# Patient Record
Sex: Male | Born: 1975 | Race: Black or African American | Hispanic: No | Marital: Married | Smoking: Current every day smoker
Health system: Southern US, Community
[De-identification: ages and names within clinical notes are randomized; demographics above are authoritative.]

## PROBLEM LIST (undated history)

## (undated) DIAGNOSIS — I10 Essential (primary) hypertension: Secondary | ICD-10-CM

## (undated) HISTORY — PX: TUMOR EXCISION: SHX421

---

## 2013-06-07 ENCOUNTER — Emergency Department: Payer: Self-pay | Admitting: Internal Medicine

## 2014-01-18 ENCOUNTER — Encounter (HOSPITAL_COMMUNITY): Payer: Self-pay | Admitting: Emergency Medicine

## 2014-01-18 ENCOUNTER — Emergency Department (HOSPITAL_COMMUNITY)
Admission: EM | Admit: 2014-01-18 | Discharge: 2014-01-18 | Discharge status: Against Medical Advice | Payer: Medicaid - Out of State | Attending: Emergency Medicine | Admitting: Emergency Medicine

## 2014-01-18 DIAGNOSIS — F17200 Nicotine dependence, unspecified, uncomplicated: Secondary | ICD-10-CM | POA: Insufficient documentation

## 2014-01-18 DIAGNOSIS — R079 Chest pain, unspecified: Secondary | ICD-10-CM | POA: Diagnosis not present

## 2014-01-18 DIAGNOSIS — R0602 Shortness of breath: Secondary | ICD-10-CM | POA: Diagnosis present

## 2014-01-18 LAB — I-STAT TROPONIN, ED: Troponin i, poc: 0 ng/mL (ref 0.00–0.08)

## 2014-01-18 LAB — CBC
HEMATOCRIT: 41.5 % (ref 39.0–52.0)
Hemoglobin: 13.4 g/dL (ref 13.0–17.0)
MCH: 19.6 pg — ABNORMAL LOW (ref 26.0–34.0)
MCHC: 32.3 g/dL (ref 30.0–36.0)
MCV: 60.7 fL — AB (ref 78.0–100.0)
PLATELETS: 259 10*3/uL (ref 150–400)
RBC: 6.84 MIL/uL — AB (ref 4.22–5.81)
RDW: 18 % — ABNORMAL HIGH (ref 11.5–15.5)
WBC: 6.5 10*3/uL (ref 4.0–10.5)

## 2014-01-18 LAB — BASIC METABOLIC PANEL
ANION GAP: 13 (ref 5–15)
BUN: 13 mg/dL (ref 6–23)
CO2: 26 meq/L (ref 19–32)
CREATININE: 1.11 mg/dL (ref 0.50–1.35)
Calcium: 9.4 mg/dL (ref 8.4–10.5)
Chloride: 104 mEq/L (ref 96–112)
GFR calc Af Amer: >90 mL/min (ref >90)
GFR calc non Af Amer: 83 mL/min — ABNORMAL LOW (ref >90)
Glucose, Bld: 98 mg/dL (ref 70–99)
Potassium: 3.7 mEq/L (ref 3.7–5.3)
Sodium: 143 mEq/L (ref 137–147)

## 2014-01-18 MED ORDER — ASPIRIN 325 MG PO TABS: 325.0000 mg | ORAL_TABLET | ORAL | Status: DC

## 2014-01-18 NOTE — ED Notes (Signed)
Pt reports onset of mid sternal chest pain yesterday. States tried to rest hoping pain would go away. Pt denies radiation of pain, some SOB. Lung sounds, clear equal, pt NAD, VSS. EKG obtained, will start protocols and reassess.

## 2014-01-18 NOTE — ED Notes (Signed)
Pt states he is leaving, did not speak to RN but gave pager, pt labels, and BP cuff to registration- registration noted patient leaving.

## 2015-07-22 ENCOUNTER — Encounter: Payer: Self-pay | Admitting: *Deleted

## 2015-07-22 ENCOUNTER — Emergency Department
Admission: EM | Admit: 2015-07-22 | Discharge: 2015-07-22 | Disposition: A | Discharge status: Home / Self Care | Payer: Medicare Other | Attending: Emergency Medicine | Admitting: Emergency Medicine

## 2015-07-22 ENCOUNTER — Emergency Department: Payer: Medicare Other

## 2015-07-22 DIAGNOSIS — J189 Pneumonia, unspecified organism: Secondary | ICD-10-CM

## 2015-07-22 DIAGNOSIS — F17210 Nicotine dependence, cigarettes, uncomplicated: Secondary | ICD-10-CM | POA: Diagnosis not present

## 2015-07-22 DIAGNOSIS — R05 Cough: Secondary | ICD-10-CM | POA: Diagnosis present

## 2015-07-22 DIAGNOSIS — J159 Unspecified bacterial pneumonia: Secondary | ICD-10-CM | POA: Diagnosis not present

## 2015-07-22 LAB — RAPID INFLUENZA A&B ANTIGENS (ARMC ONLY): INFLUENZA B (ARMC): NEGATIVE

## 2015-07-22 LAB — RAPID INFLUENZA A&B ANTIGENS: Influenza A (ARMC): NEGATIVE

## 2015-07-22 MED ORDER — AZITHROMYCIN 500 MG PO TABS: 500.0000 mg | ORAL_TABLET | Freq: Every day | ORAL | Status: DC

## 2015-07-22 MED ORDER — HYDROCOD POLST-CPM POLST ER 10-8 MG/5ML PO SUER: 5.0000 mL | Freq: Two times a day (BID) | ORAL | Status: DC

## 2015-07-22 MED ORDER — HYDROCOD POLST-CPM POLST ER 10-8 MG/5ML PO SUER
5.0000 mL | Freq: Once | ORAL | Status: AC
  Administered 2015-07-22: 5 mL via ORAL

## 2015-07-22 MED ORDER — IBUPROFEN 800 MG PO TABS: 800.0000 mg | ORAL_TABLET | Freq: Three times a day (TID) | ORAL | Status: DC | PRN

## 2015-07-22 NOTE — ED Provider Notes (Signed)
[facility] Emergency Department Provider Note  ____________________________________________  Time seen: Approximately 4:18 PM  I have reviewed the triage vital signs and the nursing notes.   HISTORY  Chief Complaint Influenza    HPI Colton Ramos is a 40 y.o. male patient complaining of cough congestion fever for 2 days. Patient states taking over-the-counter medications without relief. He denies any nausea vomiting diarrhea. Patient states he has nasal congestion and a postnasal drainage. Patient also complaining of body aches. Patient rates his pain discomfort as 8/10. Describes this pain as "achy". Patient is not taking a flu shot this season.   No past medical history on file.  There are no active problems to display for this patient.   No past surgical history on file.  Current Outpatient Rx  Name  Route  Sig  Dispense  Refill  . azithromycin (ZITHROMAX) 500 MG tablet   Oral   Take 1 tablet (500 mg total) by mouth daily. Take 1 tablet daily for 3 days.   3 tablet   0   . chlorpheniramine-HYDROcodone (TUSSIONEX PENNKINETIC ER) 10-8 MG/5ML SUER   Oral   Take 5 mLs by mouth 2 (two) times daily.   115 mL   0   . ibuprofen (ADVIL,MOTRIN) 800 MG tablet   Oral   Take 1 tablet (800 mg total) by mouth every 8 (eight) hours as needed for moderate pain.   15 tablet   0     Allergies Review of patient's allergies indicates no known allergies.  No family history on file.  Social History Social History  Substance Use Topics  . Smoking status: Current Every Day Smoker -- 1.50 packs/day    Types: Cigarettes  . Smokeless tobacco: None  . Alcohol Use: Yes     Comment: 2 beers/week    Review of Systems Constitutional: Fever, chills, and body aches. Eyes: No visual changes. ENT: No sore throat. Cardiovascular: Denies chest pain. Respiratory: Denies shortness of breath. The cough which increases with inspiration. Gastrointestinal: No  abdominal pain.  No nausea, no vomiting.  No diarrhea.  No constipation. Genitourinary: Negative for dysuria. Musculoskeletal: Negative for back pain. Skin: Negative for rash. Neurological: Negative for headaches, focal weakness or numbness.    ____________________________________________   PHYSICAL EXAM:  VITAL SIGNS: ED Triage Vitals  Enc Vitals Group     BP 07/22/15 1539 150/90 mmHg     Pulse Rate 07/22/15 1539 105     Resp 07/22/15 1539 20     Temp 07/22/15 1539 100.3 F (37.9 C)     Temp Source 07/22/15 1539 Oral     SpO2 07/22/15 1539 98 %     Weight 07/22/15 1539 202 lb (91.627 kg)     Height 07/22/15 1539 5\' 10"  (1.778 m)     Head Cir --      Peak Flow --      Pain Score 07/22/15 1540 8     Pain Loc --      Pain Edu? --      Excl. in GC? --     Constitutional: Alert and oriented. Well appearing and in no acute distress. Eyes: Conjunctivae are normal. PERRL. EOMI. Head: Atraumatic. Nose: Bilateral edematous nasal turbinates with clear rhinorrhea.  Mouth/Throat: Mucous membranes are moist.  Oropharynx non-erythematous. Postnasal drainage Neck: No stridor.  No cervical spine tenderness to palpation. Hematological/Lymphatic/Immunilogical: No cervical lymphadenopathy. Cardiovascular: Normal rate, regular rhythm. Grossly normal heart sounds.  Good peripheral circulation. Respiratory: Normal respiratory effort.  No retractions. Lungs bilateral Rales and productive cough. Gastrointestinal: Soft and nontender. No distention. No abdominal bruits. No CVA tenderness. Musculoskeletal: No lower extremity tenderness nor edema.  No joint effusions. Neurologic:  Normal speech and language. No gross focal neurologic deficits are appreciated. No gait instability. Skin:  Skin is warm, dry and intact. No rash noted. Psychiatric: Mood and affect are normal. Speech and behavior are normal.  ____________________________________________   LABS (all labs ordered are listed, but only  abnormal results are displayed)  Labs Reviewed  RAPID INFLUENZA A&B ANTIGENS ([facility] ONLY)   ____________________________________________  EKG   ____________________________________________  RADIOLOGY  Chest x-ray suggestive of mild developing lingular pneumonia left heart border. ____________________________________________   PROCEDURES  Procedure(s) performed: None  Critical Care performed: No  ____________________________________________   INITIAL IMPRESSION / ASSESSMENT AND PLAN / ED COURSE  Pertinent labs & imaging results that were available during my care of the patient were reviewed by me and considered in my medical decision making (see chart for details).  Pneumonia. Discussed x-ray finding with patient. Patient given prescription for Zithromaxand Tussionex. Patient advised to take ibuprofen for fever bodyaches. Patient had a negativerapid flu test. ____________________________________________   FINAL CLINICAL IMPRESSION(S) / ED DIAGNOSES  Final diagnoses:  Community acquired pneumonia      Joni Reining, PA-C 07/22/15 1715  Minna Antis, MD 07/23/15 [phone]

## 2015-07-22 NOTE — Discharge Instructions (Signed)

## 2015-07-22 NOTE — ED Notes (Signed)
Pt reports cough, congestion and fever for 2 days.  Pt taking otc meds without relief.  Pt alert.

## 2015-07-22 NOTE — ED Notes (Signed)
Pt reports flu like sx for 2 days.  Pt reports fever and cough.

## 2016-05-11 ENCOUNTER — Emergency Department
Admission: EM | Admit: 2016-05-11 | Discharge: 2016-05-11 | Disposition: A | Discharge status: Home / Self Care | Payer: Medicare Other | Attending: Emergency Medicine | Admitting: Emergency Medicine

## 2016-05-11 ENCOUNTER — Encounter: Payer: Self-pay | Admitting: Emergency Medicine

## 2016-05-11 DIAGNOSIS — F17210 Nicotine dependence, cigarettes, uncomplicated: Secondary | ICD-10-CM | POA: Insufficient documentation

## 2016-05-11 DIAGNOSIS — I10 Essential (primary) hypertension: Secondary | ICD-10-CM

## 2016-05-11 DIAGNOSIS — Z791 Long term (current) use of non-steroidal anti-inflammatories (NSAID): Secondary | ICD-10-CM | POA: Diagnosis not present

## 2016-05-11 HISTORY — DX: Essential (primary) hypertension: I10

## 2016-05-11 NOTE — Discharge Instructions (Signed)
Continue to dose your blood pressure medicines as directed. Monitor and record your blood pressure readings for your provider. Avoid excessive salt intake and processed foods.

## 2016-05-11 NOTE — ED Notes (Signed)
Pt to ed with c/o HTN,  Pt states he recently changed BP meds on Thursday and is concerned that blood pressure is not back to normal.  Pt PMD [facility] in [place].  Pt denies chest pain, denies sob, denies headache.  Pt reports bp at home today was 159/100.

## 2016-05-11 NOTE — ED Triage Notes (Signed)
Patient presents to the ED with hypertension x 1 week. Patient reports his blood pressure was 159/100 today at home.  Patient denies headache, blurry vision, and dizziness.  Patient reports his pcp increased his dose of lisinopril/hctz from 20/12.5 to 20/25 and started patient on amlodipine 10mg /day.  Patient states, "I was just thinking that maybe they're not working."  Patient is in no obvious distress at this time.

## 2016-05-12 NOTE — ED Provider Notes (Signed)
[facility] Emergency Department Provider Note ____________________________________________  Time seen: 1856  I have reviewed the triage vital signs and the nursing notes.  HISTORY  Chief Complaint  Hypertension  HPI Colton Ramos is a 41 y.o. male presents to the ED for evaluation of blood pressure medicine response. He has been on Lisinopril/HCTZ 20/12.5 for the last 15 years. 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 He followed up with his primary care provider last week on Thursday, and had his dose increased to lisinopril/HCTZ 20/25 mg and added amlodipine 10 mg daily to his regimen. We will monitor his blood pressure since the increased medication about every other day. He reports today a reading of 150/100 mmHg. He denies any visual disturbances, headaches, chest pain, shortness of breath, or distal paresthesias. He was advised by his girlfriend presented today for what he thought was a poor response to the medication.  Past Medical History:  Diagnosis Date  . Hypertension     There are no active problems to display for this patient.  History reviewed. No pertinent surgical history.  Prior to Admission medications   Medication Sig Start Date End Date Taking? Authorizing Provider  azithromycin (ZITHROMAX) 500 MG tablet Take 1 tablet (500 mg total) by mouth daily. Take 1 tablet daily for 3 days. 07/22/15   Joni Reiningonald K Smith, PA-C  chlorpheniramine-HYDROcodone [facility](TUSSIONEX PENNKINETIC ER) 10-8 MG/5ML SUER Take 5 mLs by mouth 2 (two) times daily. 07/22/15   Joni Reiningonald K Smith, PA-C  ibuprofen (ADVIL,MOTRIN) 800 MG tablet Take 1 tablet (800 mg total) by mouth every 8 (eight) hours as needed for moderate pain. 07/22/15   Joni Reiningonald K Smith, PA-C   Allergies Patient has no known allergies.  No family history on file.  Social History Social History  Substance Use Topics  . Smoking status: Current Every Day  Smoker    Packs/day: 1.50    Types: Cigarettes  . Smokeless tobacco: Never Used  . Alcohol use Yes     Comment: 2 beers/week    Review of Systems  Constitutional: Negative for fever. Eyes: Negative for visual changes. ENT: Negative for sore throat. Cardiovascular: Negative for chest pain. Respiratory: Negative for shortness of breath. Gastrointestinal: Negative for abdominal pain, vomiting and diarrhea. Genitourinary: Negative for dysuria. Musculoskeletal: Negative for back pain. Skin: Negative for rash. Neurological: Negative for headaches, focal weakness or numbness. ____________________________________________  PHYSICAL EXAM:  VITAL SIGNS: ED Triage Vitals [05/11/16 1727]  Enc Vitals Group     BP (!) 145/96     Pulse Rate 78     Resp 18     Temp 98.4 F (36.9 C)     Temp Source Oral     SpO2 99 %     Weight 203 lb (92.1 kg)     Height 5\' 10"  (1.778 m)     Head Circumference      Peak Flow      Pain Score 0     Pain Loc      Pain Edu?      Excl. in GC?     Constitutional: Alert and oriented. Well appearing and in no distress. Head: Normocephalic and atraumatic. Eyes: Conjunctivae are normal. PERRL. Normal extraocular movements Nose: No congestion/rhinorrhea/epistaxis. Neck: Supple. No thyromegaly. Cardiovascular: Normal rate, regular rhythm. Normal distal pulses. Respiratory: Normal respiratory effort. No wheezes/rales/rhonchi. Gastrointestinal: Soft and nontender. No distention. Musculoskeletal: Nontender with normal range of motion in all extremities.  Neurologic:  Normal gait without ataxia. Normal speech and language. No gross focal neurologic deficits are appreciated. Skin:  Skin is warm, dry and intact. No rash noted. Psychiatric: Mood and affect are normal. Patient exhibits appropriate insight and judgment. ____________________________________________  INITIAL IMPRESSION / ASSESSMENT AND PLAN / ED COURSE  Patient with a benign exam, and grade 1  hypertension, on an appropriate regimen of lisinopril/HCTZ and amlodipine. Patient was advised that he shows no signs of an hypertensive emergency, requiring immediate reduction of his blood pressure. He is further advised that it may take several weeks for his blood pressure to respond to the new increased medication dose. He is encouraged to continue the monitor his blood pressure, and record his readings. He should she have this admission with his primary care provider when he follows up in 3 weeks. Patient is discharged at this time, reassured about his blood pressure readings, and voices no other concerns.  Clinical Course    ____________________________________________  FINAL CLINICAL IMPRESSION(S) / ED DIAGNOSES  Final diagnoses:  Essential hypertension      Lissa Hoard, PA-C 05/13/16 0005    Phineas Semen, MD 05/16/16 [phone]

## 2016-10-04 ENCOUNTER — Emergency Department: Payer: Medicare Other

## 2016-10-04 ENCOUNTER — Emergency Department
Admission: EM | Admit: 2016-10-04 | Discharge: 2016-10-04 | Disposition: A | Discharge status: Home / Self Care | Payer: Medicare Other | Attending: Emergency Medicine | Admitting: Emergency Medicine

## 2016-10-04 DIAGNOSIS — Z79899 Other long term (current) drug therapy: Secondary | ICD-10-CM | POA: Diagnosis not present

## 2016-10-04 DIAGNOSIS — M25561 Pain in right knee: Secondary | ICD-10-CM | POA: Diagnosis present

## 2016-10-04 DIAGNOSIS — I10 Essential (primary) hypertension: Secondary | ICD-10-CM | POA: Diagnosis not present

## 2016-10-04 DIAGNOSIS — F17210 Nicotine dependence, cigarettes, uncomplicated: Secondary | ICD-10-CM | POA: Insufficient documentation

## 2016-10-04 MED ORDER — IBUPROFEN 800 MG PO TABS
800.0000 mg | ORAL_TABLET | Freq: Once | ORAL | Status: AC
  Administered 2016-10-04: 800 mg via ORAL
  Filled 2016-10-04: qty 1

## 2016-10-04 MED ORDER — HYDROCODONE-ACETAMINOPHEN 5-325 MG PO TABS
1.0000 | ORAL_TABLET | ORAL | Status: AC
  Administered 2016-10-04: 1 via ORAL
  Filled 2016-10-04: qty 1

## 2016-10-04 MED ORDER — IBUPROFEN 800 MG PO TABS: 800.0000 mg | ORAL_TABLET | Freq: Three times a day (TID) | ORAL | 0 refills | Status: DC | PRN

## 2016-10-04 NOTE — ED Provider Notes (Signed)
[facility]-EMERGENCY DEPARTMENT Provider Note   CSN: [id number] Arrival date & time: 10/04/16  1421     History   Chief Complaint Chief Complaint  Patient presents with  . Leg Pain    HPI Colton Ramos is a 41 y.o. male presents to the emergency department for evaluation of right knee pain. Patient was running in the grass yesterday, slipped, felt a pop along the lateral aspect of his right knee. She has been ambulatory with a limp. He has moderate swelling. Denies any other injury to his body. No calf pain bilaterally, no chest pain, shortness of breath, coughing, fevers or chills. He has not taken any medication for pain. His pain is moderate. Denies any head injury or loss of consciousness.  HPI  Past Medical History:  Diagnosis Date  . Hypertension     There are no active problems to display for this patient.   History reviewed. No pertinent surgical history.     Home Medications    Prior to Admission medications   Medication Sig Start Date End Date Taking? Authorizing Provider  azithromycin (ZITHROMAX) 500 MG tablet Take 1 tablet (500 mg total) by mouth daily. Take 1 tablet daily for 3 days. 07/22/15   Joni ReiningSmith, Ronald K, PA-C  chlorpheniramine-HYDROcodone (TUSSIONEX PENNKINETIC ER) 10-8 MG/5ML SUER Take 5 mLs by mouth 2 (two) times daily. 07/22/15   Joni ReiningSmith, Ronald K, PA-C  ibuprofen (IBU) 800 MG tablet Take 1 tablet (800 mg total) by mouth every 8 (eight) hours as needed. 10/04/16   Evon SlackGaines, Thomas C, PA-C    Family History No family history on file.  Social History Social History  Substance Use Topics  . Smoking status: Current Every Day Smoker    Packs/day: 1.50    Types: Cigarettes  . Smokeless tobacco: Never Used  . Alcohol use Yes     Comment: 2 beers/week     Allergies   Patient has no known allergies.   Review of Systems Review of Systems  Constitutional: Negative.  Negative for activity change, appetite change, chills and fever.  HENT: Negative  for congestion, ear pain, mouth sores, rhinorrhea, sinus pressure, sore throat and trouble swallowing.   Eyes: Negative for photophobia, pain and discharge.  Respiratory: Negative for cough, chest tightness and shortness of breath.   Cardiovascular: Negative for chest pain and leg swelling.  Gastrointestinal: Negative for abdominal distention, abdominal pain, diarrhea, nausea and vomiting.  Genitourinary: Negative for difficulty urinating and dysuria.  Musculoskeletal: Positive for arthralgias and joint swelling. Negative for back pain and gait problem.  Skin: Negative for color change and rash.  Neurological: Negative for dizziness and headaches.  Hematological: Negative for adenopathy.  Psychiatric/Behavioral: Negative for agitation and behavioral problems.     Physical Exam Updated Vital Signs BP 137/89 (BP Location: Right Arm)   Pulse 97   Temp 99.9 F (37.7 C) (Oral)   Resp 18   Ht 5\' 10"  (1.778 m)   Wt 86.2 kg (190 lb)   SpO2 98%   BMI 27.26 kg/m   Physical Exam  Constitutional: He appears well-developed and well-nourished.  HENT:  Head: Normocephalic and atraumatic.  Eyes: Conjunctivae are normal.  Neck: Neck supple.  Cardiovascular: Normal rate.   Pulmonary/Chest: Effort normal. No respiratory distress.  Abdominal: Soft. There is no tenderness.  Musculoskeletal:  Right Lower Extremities: Examination of the right lower extremity reveals no bony abnormality, no edema, mild effusion and no ecchymosis.  There is no valgus or varus abnormality.  The patient is tender  along the lateral joint line, and is non-tender along the medial joint line.  The patient has 0-100 range of motion. There is moderate discomfort with hyperflexion, patient has a positive lateral McMurray's test.  There is no retropatellar discomfort.  The patient has a negative patella stretch test.  The patient has a negative varus stress test and a negative valgus stress test, in looking for stability.  The  patient has a negative Lachman's test.  Vascular: The patient has a negative Denna Haggard' test bilaterally.  The patient had a normal dorsalis pedis and posterior tibial pulse.  There is normal skin warmth.  There is normal capillary refill bilaterally.    Neurologic: The patient has a negative straight leg raise.  The patient has normal muscle strength testing for the quadriceps, calves, ankle dorsiflexion, ankle plantarflexion, and extensor hallicus longus.  The patient has sensation that is intact to light touch.    Neurological: He is alert.  Skin: Skin is warm and dry.  Psychiatric: He has a normal mood and affect.  Nursing note and vitals reviewed.    ED Treatments / Results  Labs (all labs ordered are listed, but only abnormal results are displayed) Labs Reviewed - No data to display  EKG  EKG Interpretation None       Radiology Dg Knee Complete 4 Views Right  Result Date: 10/04/2016 CLINICAL DATA:  Larey Seat in yard last night. Lateral right knee pain and swelling. Initial encounter. EXAM: RIGHT KNEE - COMPLETE 4+ VIEW COMPARISON:  None. FINDINGS: No evidence of fracture, dislocation, or joint effusion. No evidence of arthropathy or other focal bone abnormality. Soft tissues are unremarkable. IMPRESSION: Negative. Electronically Signed   By: Sebastian Ache M.D.   On: 10/04/2016 15:10    Procedures Procedures (including critical care time)  Medications Ordered in ED Medications  HYDROcodone-acetaminophen (NORCO/VICODIN) 5-325 MG per tablet 1 tablet (1 tablet Oral Given 10/04/16 1504)  ibuprofen (ADVIL,MOTRIN) tablet 800 mg (800 mg Oral Given 10/04/16 1504)     Initial Impression / Assessment and Plan / ED Course  I have reviewed the triage vital signs and the nursing notes.  Pertinent labs & imaging results that were available during my care of the patient were reviewed by me and considered in my medical decision making (see chart for details).     41 year old male with  right knee injury 24 hours ago. Patient slipped in the grass, felt a pop along the lateral aspect of the knee. Developed pain and swelling. He has a mild effusion, x-rays are negative for any acute bony abnormality. Physical exam findings concerning for possible internal derangement, lateral meniscal tear. Patient's given a prescription for ibuprofen, Ace wrap is applied. He will follow-up orthopedics. He will return to the emergency department for any worsening symptoms or urgent changes in his health.  Final Clinical Impressions(s) / ED Diagnoses   Final diagnoses:  Acute pain of right knee  Mechanical knee pain, right    New Prescriptions New Prescriptions   IBUPROFEN (IBU) 800 MG TABLET    Take 1 tablet (800 mg total) by mouth every 8 (eight) hours as needed.     Evon Slack, PA-C 10/04/16 1546    Phineas Semen, MD 10/04/16 [phone]

## 2016-10-04 NOTE — ED Notes (Signed)
Pt also verbalized having cold chills and sweats since yesterday. Pt denies NVD or other symptoms. Pt has elevated temp at this time.

## 2016-10-04 NOTE — ED Notes (Signed)
AAOx3.  Skin warm and dry. NAD.  Ambulates with easy and steady gait.   

## 2016-10-04 NOTE — ED Triage Notes (Signed)
Pt fell last night in yard, c/o right knee and leg pain since.

## 2016-10-04 NOTE — ED Notes (Signed)
FIRST NURSE NOTE:  Pt fell yesterday playing in the wet grass and heard a snap on his right leg. Pt ambulated into lobby, placed in a wheelchair.  Leg wrapped with icy hot wrap.

## 2016-10-04 NOTE — Discharge Instructions (Signed)
Please rest ice and elevate the knee. Follow-up with Dr. Rondel BatonMiller's office in one week if no improvement. Use Ace wrap as needed for discomfort.

## 2016-12-27 ENCOUNTER — Emergency Department
Admission: EM | Admit: 2016-12-27 | Discharge: 2016-12-27 | Disposition: A | Discharge status: Home / Self Care | Payer: Medicare Other | Attending: Emergency Medicine | Admitting: Emergency Medicine

## 2016-12-27 DIAGNOSIS — Y929 Unspecified place or not applicable: Secondary | ICD-10-CM | POA: Diagnosis not present

## 2016-12-27 DIAGNOSIS — S8991XA Unspecified injury of right lower leg, initial encounter: Secondary | ICD-10-CM | POA: Diagnosis present

## 2016-12-27 DIAGNOSIS — I10 Essential (primary) hypertension: Secondary | ICD-10-CM | POA: Diagnosis not present

## 2016-12-27 DIAGNOSIS — M25461 Effusion, right knee: Secondary | ICD-10-CM | POA: Diagnosis not present

## 2016-12-27 DIAGNOSIS — X501XXA Overexertion from prolonged static or awkward postures, initial encounter: Secondary | ICD-10-CM | POA: Insufficient documentation

## 2016-12-27 DIAGNOSIS — Y9301 Activity, walking, marching and hiking: Secondary | ICD-10-CM | POA: Insufficient documentation

## 2016-12-27 DIAGNOSIS — F17210 Nicotine dependence, cigarettes, uncomplicated: Secondary | ICD-10-CM | POA: Diagnosis not present

## 2016-12-27 DIAGNOSIS — S8391XD Sprain of unspecified site of right knee, subsequent encounter: Secondary | ICD-10-CM | POA: Insufficient documentation

## 2016-12-27 DIAGNOSIS — Y999 Unspecified external cause status: Secondary | ICD-10-CM | POA: Insufficient documentation

## 2016-12-27 MED ORDER — NABUMETONE 750 MG PO TABS: 750.0000 mg | ORAL_TABLET | Freq: Two times a day (BID) | ORAL | 1 refills | Status: DC

## 2016-12-27 NOTE — ED Provider Notes (Signed)
[facility] Emergency Department Provider Note ____________________________________________  Time seen: 1239  I have reviewed the triage vital signs and the nursing notes.  HISTORY  Chief Complaint  Knee Pain  HPI Colton Ramos is a 41 y.o. male presents to the ED for chronic right knee pain, with recent injury. Patient describes a twisting on a planted foot about a week prior. He had initiallateral knee pain and developed an effusion from that. He was initially seen here about 3 months prior for an acute knee strain and diagnosed with a probable lateral meniscus injury. He admitted he did not follow-up with orthopedics following his initial evaluation. He presents now reporting intermittent knee pain since that injury today. He has been able to ambulate without difficulty to the right knee denies any intermittent catch, click, LOC, or give way. He denies any distal swelling, bruising, or paresthesias.  Past Medical History:  Diagnosis Date  . Hypertension    There are no active problems to display for this patient.  History reviewed. No pertinent surgical history.  Prior to Admission medications   Medication Sig Start Date End Date Taking? Authorizing Provider  azithromycin (ZITHROMAX) 500 MG tablet Take 1 tablet (500 mg total) by mouth daily. Take 1 tablet daily for 3 days. 07/22/15   Joni Reining, PA-C  chlorpheniramine-HYDROcodone (TUSSIONEX PENNKINETIC ER) 10-8 MG/5ML SUER Take 5 mLs by mouth 2 (two) times daily. 07/22/15   Joni Reining, PA-C  ibuprofen (IBU) 800 MG tablet Take 1 tablet (800 mg total) by mouth every 8 (eight) hours as needed. 10/04/16   Evon Slack, PA-C  nabumetone (RELAFEN) 750 MG tablet Take 1 tablet (750 mg total) by mouth 2 (two) times daily. 12/27/16   Eulah Walkup, Charlesetta Ivory, PA-C    Allergies Patient has no known allergies.  No family history on file.  Social History Social History  Substance Use Topics  . Smoking  status: Current Every Day Smoker    Packs/day: 1.50    Types: Cigarettes  . Smokeless tobacco: Never Used  . Alcohol use Yes     Comment: 2 beers/week    Review of Systems  Constitutional: Negative for fever. Cardiovascular: Negative for chest pain. Respiratory: Negative for shortness of breath. Musculoskeletal: Negative for back pain. Right knee pain with swelling Skin: Negative for rash. Neurological: Negative for headaches, focal weakness or numbness. ____________________________________________  PHYSICAL EXAM:  VITAL SIGNS: ED Triage Vitals [12/27/16 1212]  Enc Vitals Group     BP (!) 169/104     Pulse Rate 84     Resp 16     Temp 98.3 F (36.8 C)     Temp Source Oral     SpO2 99 %     Weight 190 lb (86.2 kg)     Height 5\' 10"  (1.778 m)     Head Circumference      Peak Flow      Pain Score 10     Pain Loc      Pain Edu?      Excl. in GC?     Constitutional: Alert and oriented. Well appearing and in no distress. Head: Normocephalic and atraumatic. Cardiovascular: Normal rate, regular rhythm. Normal distal pulses. Respiratory: Normal respiratory effort.  Musculoskeletal: Right knee with moderate effusion noted. Normal flexion and extension range without difficulty. No patellar laxity is appreciated. No popliteal space fullness calf, or tenderness is appreciated. Patient is only mildly tender to palpation to the lateral aspect of the knee  joint. Negative anterior/posterior drawer. Negative Lachman & McMurray's on exam. Nontender with normal range of motion in all extremities.  Neurologic:  Normal gait without ataxia. Normal speech and language. No gross focal neurologic deficits are appreciated. ____________________________________________   RADIOLOGY  deferred. ____________________________________________  PROCEDURES  Watson-Jones compression wrap Knee immobilizer ____________________________________________  INITIAL IMPRESSION / ASSESSMENT AND PLAN / ED  COURSE  Patient with ED evaluation of an acute right knee sprain with effusion. This is subsequent visit for the same joint. Patient is advised to follow-up with Dr. Rosita Kea for further evaluation and management. He is discharged at this time with a knee immobilizer to wear when out of bed and ambulatory. He is also given to him why so wrap for comfort. He is advised to rest, ice, elevate the joint for comfort. He may dose the Relafen as prescribed for inflammation and swelling. Return precautions are reviewed. ____________________________________________  FINAL CLINICAL IMPRESSION(S) / ED DIAGNOSES  Final diagnoses:  Sprain of right knee, unspecified ligament, subsequent encounter  Effusion of right knee      Karmen Stabs, Charlesetta Ivory, PA-C 12/27/16 1919    Governor Rooks, MD 01/11/17 [phone]

## 2016-12-27 NOTE — ED Triage Notes (Signed)
Right knee pain for greater than one month after injury.

## 2016-12-27 NOTE — ED Notes (Signed)
Seen seen here a few months ago after injury to right knee. Continues to having pain and swelling. Has not followed up with ortho.

## 2016-12-27 NOTE — Discharge Instructions (Signed)
Your exam is consistent with a knee strain and effusion. You should wear the knee brace as needed. Take the prescription as directed. Apply ice to reduce swelling. Follow-up with Ortho for further evaluation.

## 2017-05-05 ENCOUNTER — Emergency Department
Admission: EM | Admit: 2017-05-05 | Discharge: 2017-05-05 | Discharge status: Absent without leave | Payer: Medicare Other

## 2017-05-05 NOTE — ED Notes (Signed)
Pt wants to be seen for lower back pain. NAD.

## 2017-05-06 ENCOUNTER — Emergency Department
Admission: EM | Admit: 2017-05-06 | Discharge: 2017-05-06 | Disposition: A | Discharge status: Home / Self Care | Payer: Medicare Other | Attending: Student in an Organized Health Care Education/Training Program | Admitting: Student in an Organized Health Care Education/Training Program

## 2017-05-06 ENCOUNTER — Emergency Department: Payer: Medicare Other

## 2017-05-06 ENCOUNTER — Encounter: Payer: Self-pay | Admitting: Emergency Medicine

## 2017-05-06 DIAGNOSIS — Y999 Unspecified external cause status: Secondary | ICD-10-CM | POA: Insufficient documentation

## 2017-05-06 DIAGNOSIS — F17210 Nicotine dependence, cigarettes, uncomplicated: Secondary | ICD-10-CM | POA: Insufficient documentation

## 2017-05-06 DIAGNOSIS — Y929 Unspecified place or not applicable: Secondary | ICD-10-CM | POA: Diagnosis not present

## 2017-05-06 DIAGNOSIS — I10 Essential (primary) hypertension: Secondary | ICD-10-CM | POA: Diagnosis not present

## 2017-05-06 DIAGNOSIS — X509XXA Other and unspecified overexertion or strenuous movements or postures, initial encounter: Secondary | ICD-10-CM | POA: Insufficient documentation

## 2017-05-06 DIAGNOSIS — Z79899 Other long term (current) drug therapy: Secondary | ICD-10-CM | POA: Diagnosis not present

## 2017-05-06 DIAGNOSIS — S3992XA Unspecified injury of lower back, initial encounter: Secondary | ICD-10-CM | POA: Diagnosis present

## 2017-05-06 DIAGNOSIS — Y939 Activity, unspecified: Secondary | ICD-10-CM | POA: Diagnosis not present

## 2017-05-06 DIAGNOSIS — S39012A Strain of muscle, fascia and tendon of lower back, initial encounter: Secondary | ICD-10-CM | POA: Insufficient documentation

## 2017-05-06 MED ORDER — ORPHENADRINE CITRATE 30 MG/ML IJ SOLN
60.0000 mg | INTRAMUSCULAR | Status: AC
  Administered 2017-05-06: 60 mg via INTRAMUSCULAR
  Filled 2017-05-06: qty 2

## 2017-05-06 MED ORDER — KETOROLAC TROMETHAMINE 10 MG PO TABS: 10.0000 mg | ORAL_TABLET | Freq: Three times a day (TID) | ORAL | 0 refills | Status: DC

## 2017-05-06 MED ORDER — CYCLOBENZAPRINE HCL 5 MG PO TABS: 5.0000 mg | ORAL_TABLET | Freq: Three times a day (TID) | ORAL | 0 refills | Status: DC | PRN

## 2017-05-06 MED ORDER — KETOROLAC TROMETHAMINE 30 MG/ML IJ SOLN
30.0000 mg | Freq: Once | INTRAMUSCULAR | Status: AC
  Administered 2017-05-06: 30 mg via INTRAMUSCULAR
  Filled 2017-05-06: qty 1

## 2017-05-06 NOTE — ED Triage Notes (Signed)
Pt comes into the ED via POV c/o lower back pain that has been going on for 3 weeks.  Patient denies a labor intensive job or any injury to the back.  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  Patient ambulatory to triage at this time and in NAD with even and unlabored respirations.  Patient denies any urinary difficulties associated with the low back pain.

## 2017-05-06 NOTE — ED Provider Notes (Signed)
[facility] Emergency Department Provider Note ____________________________________________  Time seen: 2132  I have reviewed the triage vital signs and the nursing notes.  HISTORY  Chief Complaint  Back Pain  HPI Colton Ramos is a 41 y.o. male presents to the ED accompanied by his family, for evaluation of intermittent low back pain for the last 3 weeks. His symptoms worsened yesterday. The patient denies any particular injury, accident, or trauma.  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  He also has to check his load so he is constantly climbing on top of and under the trailer.  He describes midline low back pain without any referral.  He describes pain is worsened with prolonged sitting and standing.  He also describes increased pain with transitioning from sit to stand.  He denies any distal paresthesias, footdrop, or bladder incontinence.  He has taken ibuprofen and Tylenol with limited benefit.  Past Medical History:  Diagnosis Date  . Hypertension     There are no active problems to display for this patient.   Past Surgical History:  Procedure Laterality Date  . TUMOR EXCISION     benign    Prior to Admission medications   Medication Sig Start Date End Date Taking? Authorizing Provider  azithromycin (ZITHROMAX) 500 MG tablet Take 1 tablet (500 mg total) by mouth daily. Take 1 tablet daily for 3 days. 07/22/15   Joni ReiningSmith, Ronald K, PA-C  chlorpheniramine-HYDROcodone (TUSSIONEX PENNKINETIC ER) 10-8 MG/5ML SUER Take 5 mLs by mouth 2 (two) times daily. 07/22/15   Joni ReiningSmith, Ronald K, PA-C  cyclobenzaprine (FLEXERIL) 5 MG tablet Take 1 tablet (5 mg total) by mouth 3 (three) times daily as needed for muscle spasms. 05/06/17   Zacory Fiola, Charlesetta IvoryJenise V Bacon, PA-C  ibuprofen (IBU) 800 MG tablet Take 1 tablet (800 mg total) by mouth every 8 (eight) hours as needed. 10/04/16   Evon SlackGaines, Thomas C, PA-C  ketorolac  (TORADOL) 10 MG tablet Take 1 tablet (10 mg total) by mouth every 8 (eight) hours. 05/06/17   Bryer Gottsch, Charlesetta IvoryJenise V Bacon, PA-C  nabumetone (RELAFEN) 750 MG tablet Take 1 tablet (750 mg total) by mouth 2 (two) times daily. 12/27/16   Jiya Kissinger, Charlesetta IvoryJenise V Bacon, PA-C    Allergies Patient has no known allergies.  No family history on file.  Social History Social History   Tobacco Use  . Smoking status: Current Every Day Smoker    Packs/day: 1.50    Types: Cigarettes  . Smokeless tobacco: Never Used  Substance Use Topics  . Alcohol use: Yes    Comment: 2 beers/week  . Drug use: No    Review of Systems  Constitutional: Negative for fever. Cardiovascular: Negative for chest pain. Respiratory: Negative for shortness of breath. Gastrointestinal: Negative for abdominal pain, vomiting and diarrhea. Genitourinary: Negative for dysuria. Musculoskeletal: Positive for back pain. Skin: Negative for rash. Neurological: Negative for headaches, focal weakness or numbness. ____________________________________________  PHYSICAL EXAM:  VITAL SIGNS: ED Triage Vitals [05/06/17 2007]  Enc Vitals Group     BP (!) 159/93     Pulse Rate 88     Resp 18     Temp 100.3 F (37.9 C)     Temp Source Oral     SpO2 100 %     Weight 190 lb (86.2 kg)     Height 5\' 10"  (1.778 m)     Head Circumference      Peak  Flow      Pain Score 10     Pain Loc      Pain Edu?      Excl. in GC?     Constitutional: Alert and oriented. Well appearing and in no distress. Head: Normocephalic and atraumatic. Cardiovascular: Normal rate, regular rhythm. Normal distal pulses. Respiratory: Normal respiratory effort. No wheezes/rales/rhonchi. Gastrointestinal: Soft and nontender. No distention. Musculoskeletal: Normal spinal alignment without midline tenderness, spasm, deformity, or step-off.  Patient has localized tenderness to the lumbar sacral junction at the bilateral paraspinal muscle region.  Patient with normal  transition from sit to stand without assistance.  He is able to demonstrate lumbar extension without difficulty.  Lumbar flexion is also noted to be smooth and without hesitation. He can perform a normal squat without assistance. Normal range of motion in all extremities.  Neurologic:  Mildly antalgic gait without ataxia.CN II-XII grossly intact. Normal LE DTRs bilaterally Normal toe dorsiflexion and foot eversion. Normal toe & heel raise. Negative seated SLR bilaterally. Normal speech and language. No gross focal neurologic deficits are appreciated. Skin:  Skin is warm, dry and intact. No rash noted. Psychiatric: Mood and affect are normal. Patient exhibits appropriate insight and judgment. ____________________________________________   RADIOLOGY  Lumbar Spine  IMPRESSION: No acute fracture or malalignment. Mild loss of L5-S1 intervertebral disc space height. ____________________________________________  PROCEDURES  Procedures Toradol 30 mg IM Norflex 60 mg IM ____________________________________________  INITIAL IMPRESSION / ASSESSMENT AND PLAN / ED COURSE  Patient to the ED with intermittent low back pain for the last 3 weeks.  Patient's exam is overall benign without any acute neuromuscular deficit.  His x-rays also reassurance that shows no acute fracture dislocation.  Patient reports some improvement after ED medication administration.  He will be discharged at this time with a prescription for ketorolac and Flexeril dose as directed.  He is advised to rest as needed and avoid any shingles activity over the weekend.  [facility]  Work note is provided for 1 day as requested. ____________________________________________  FINAL CLINICAL IMPRESSION(S) / ED DIAGNOSES  Final diagnoses:  Strain of lumbar region, initial encounter      Lissa HoardMenshew, Mailyn Steichen V Bacon, PA-C 05/06/17 2257    Willy Eddyobinson, Patrick, MD 05/06/17 [phone]

## 2017-05-06 NOTE — Discharge Instructions (Signed)
Take the prescription meds as directed. [facility] provider [facility]. You x-ray is essentially normal at this time.

## 2018-10-19 IMAGING — CR DG LUMBAR SPINE 2-3V
1 series · 3 of 3 positions shown · non-contrast
Comparison: None.

CLINICAL DATA: 41 y/o  M; 3 weeks of lower back pain.

EXAM:
LUMBAR SPINE - 2-3 VIEW

[Series 1: dg lumbar spine 2-3 views · 0.14mm/px · 3 of 3 slices shown]
[im 1/3]
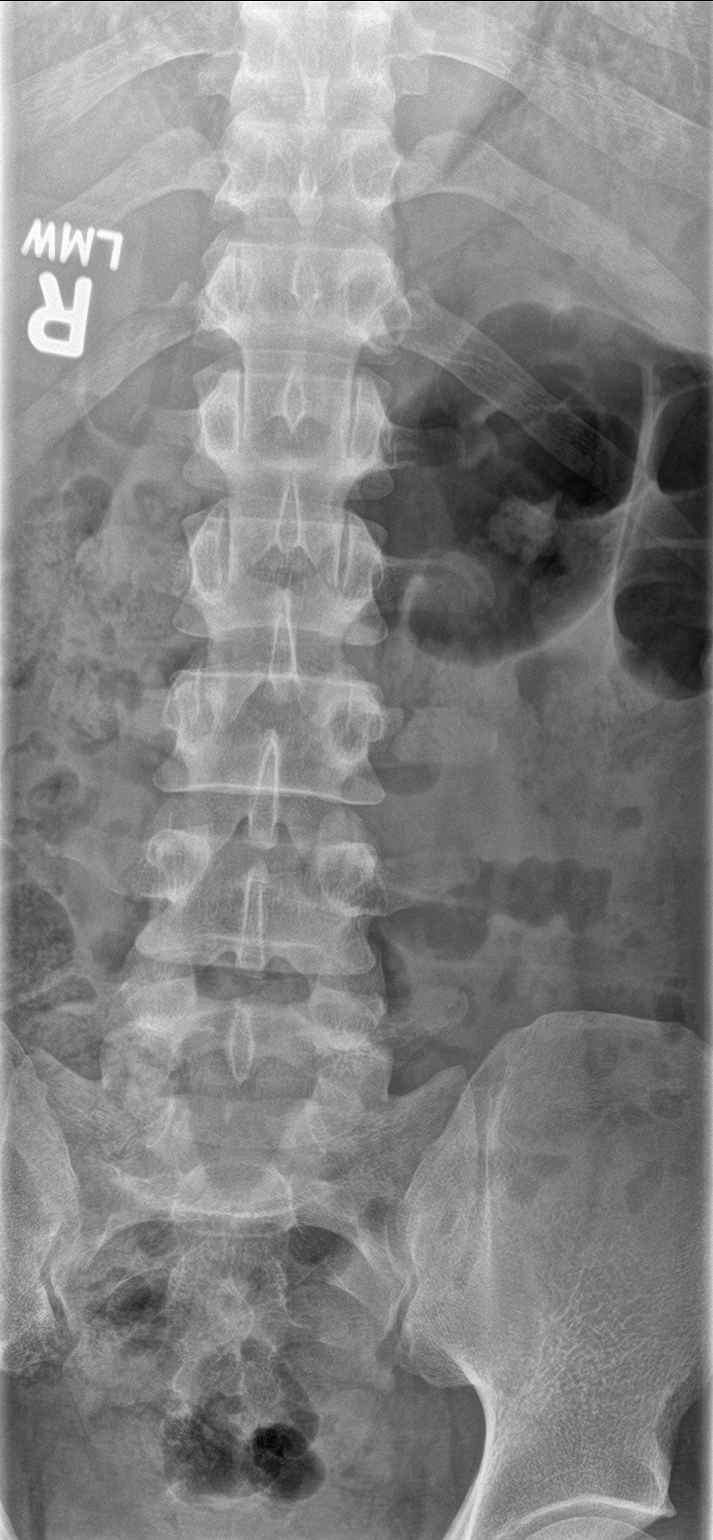
[im 2/3]
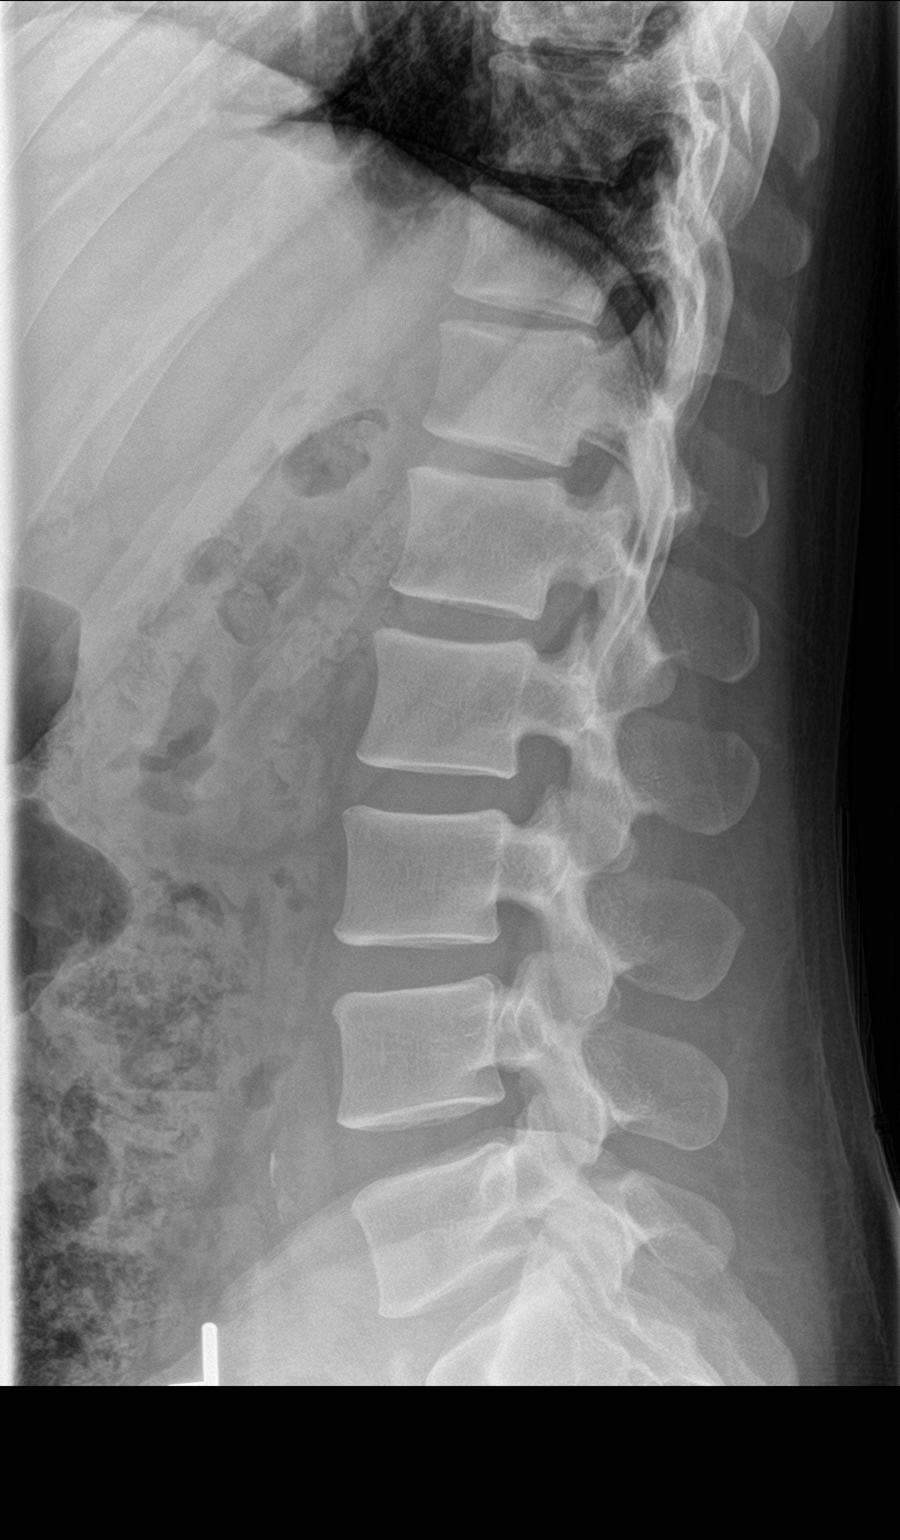
[im 3/3]
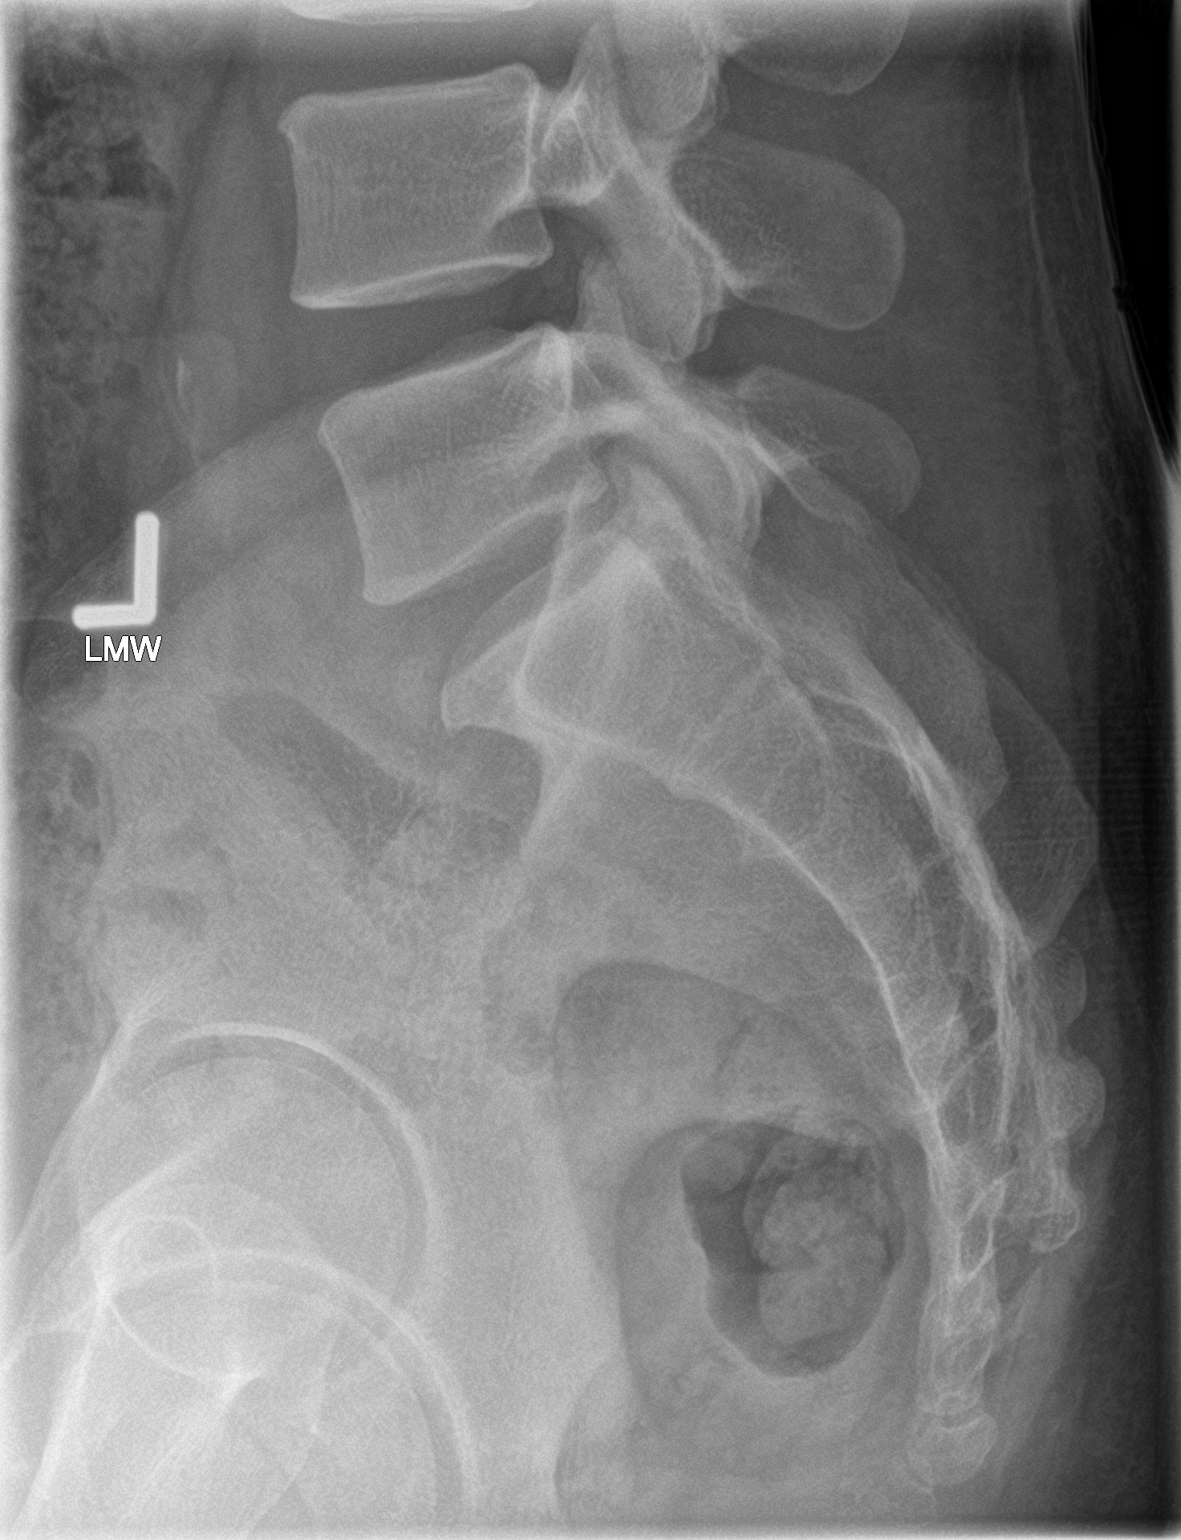

[3 of 3 positions shown; findings below may reference images not displayed]

FINDINGS: Five lumbar type non-rib-bearing vertebral bodies. Normal lumbar
lordosis without listhesis. Vertebral body heights are preserved.
Mild loss of disc space height at the L5-S1 level. No acute
fracture.
IMPRESSION: No acute fracture or malalignment. Mild loss of L5-S1 intervertebral
disc space height.

By: Cesarin Hayashi M.D.

## 2019-02-14 NOTE — Progress Notes (Addendum)
 Hematology New Patient Consultation  PATIENT PROFILE: Colton. Colton Ramos is a 43 y.o. year-old male seen in consultation at the request of Dr. Darra, for evaluation and management of microcytic  anemia.    HISTORY OF PRESENT ILLNESS:   Colton Ramos is a 43 y.o. year-old male seen in consultation at the request of Long, for evaluation and management of anemia.  Colton Ramos has had not had any previous CBCs documented in the electronic health record at [facility].    Previously on 07/19/2018 he had his labs drawn at an outside lab. At that time his  hemoglobin was 13.5, MCV 66, RDW was 20.8. Laboratory evaluation for anemia has included iron studies, which were last performed on 07/19/2018 .  At this time, his TIBC was 330 and serum iron was 58 and % saturation was 18%..   Other diagnostic studies have not been performed.  He has not had a colonoscopy and an  EGD has not been performed.  Patient denies to voluntary blood donation.  Patient reports smoking one pack a day and has alcohol only occasionally. He works as a naval architect and  lives with his family including cousins and aunts. He has two daughters and one of them who is 58 y.o.  was diagnosed with Thalassemia when admitted for treatment on pneumonia, but is currently not transfusion dependent. He has one full sister and 5 half siblings. He is not aware of any health problems in his siblings. However, he thinks his mother and his uncle both have SCT, and one niece has SCD. With regards to symptoms of anemia, he states he has fatigue.  Symptoms are exacerbated by exertion.  On a scale of 1-10, he reports his energy level as 5/10.  He denies pica for ice.    With regards to sources of blood loss, he denies any significant bleeding from othis mucocutaneous sites.  Specifically, he   denies symptoms of epistaxis, gingival bleeding, hemoptysis, hematemesis and hematuria.  He does not bruise easily or have any spontaneous bleeding.  He has not any  bleeding complications with surgery. Surgical stressors have included  has no past surgical history on file..    Patient has not received oral iron supplements for treatment of anemia.       PAST MEDICAL HISTORY/PAST SURGICAL HISTORY/SOCIAL HISTORY/FAMILY HISTORY  No past medical history on file.  No past surgical history on file.  Social History   Tobacco Use   Smoking status: Not on file  Substance Use Topics   Alcohol use: Not on file   Drug use: Not on file    family history is not on file.   MEDICATIONS  Current Outpatient Medications  Medication Sig Dispense Refill   albuterol 90 mcg/actuation inhaler INHALE 2 PUFFS BY MOUTH EVERY 4 TO 6 HOURS AS NEEDED FOR SHORTNESS OF BREATH COUGH OR WHEEZING.     amLODIPine (NORVASC) 10 MG tablet Take by mouth     atorvastatin (LIPITOR) 20 MG tablet TAKE 1 TABLET BY MOUTH IN THE EVENING     lisinopriL-hydrochlorothiazide (ZESTORETIC) 20-25 mg tablet TAKE 2 TABLETS BY MOUTH ONCE DAILY FOR BLOOD PRESSURE     No current facility-administered medications for this visit.         REVIEW OF SYSTEMS  General: + fatigue; Patient denies  fever, night sweats, weight loss, or loss of appetite HEENT:   +;Denies vision disturbance,  headaches, swallowing difficulty and/or mouth sores Respiratory:  +;Denies shortness of breath cough,  Hemoptysis, shortness of  breath with exertion, wheezing or bronchitis Cardiovascular:  +;Denies chest pain,  Palpitations, leg swelling, PND, orthopnea, leg swelling or claudication symptoms GI:  +;Denies nausea,  vomiting,  abdominal pain, diarrhea, constipation or  blood in stool GU:  +;Deies blood in urine,difficulty urinating, history of kidney stones, increased frequency of urination or nocturia Musculoskeletal:  +;Denies joint pain,  joint swelling,  muscle pain, muscle weakness or history of gout Neurologic:  +;Denies localized weakness, recent TIAs,  numbness or tingling in hands or feet Endocrine:   +;Denies heat/cold intolerance, excessive sweating, thyroid disease, or diabetes Hematologic:  +;Denies bleeding after tooth extractions, nosebleeds, easy bruising, bleeding after surgery, large bruises;  Denies taking aspirin , motrin , plavix, coumadin, or other anticoagulants    PHYSICAL EXAMINATION  BP (!) 157/102 (BP Location: Left upper arm, Patient Position: Sitting, BP Cuff Size: Adult) Comment: pt states he just resumed BP med after not taking for 3 days  Pulse 78   Temp 36.8 C (98.3 F) (Oral)   Ht 177.8 cm (5' 10)   Wt 90.9 kg (200 lb 6.4 oz)   SpO2 98%   BMI 28.75 kg/m   General appearance:  +;Alert, cooperative, no distress, appears stated age HEENT:  +;EOMI, moist oral mucosa without thrush, petechiae or ulceration LYMPHADENOPATHY: +; no sublingual, anterior/ posterior cervical, sublingual, axillary or inguinal adenopathy LUNGS:  +;clear to auscultation without wheezing, rhonchi or crackles HEART:  +;normal S1 and S2 without murmurs/gallops/rubs ABDOMEN:  +;Soft, non-tender, bowel sounds active,  no masses, no organomegaly, no distention or rebound EXTREMITIES:  +; Normal symmetric appearance without edema, changes of venous stasis or peripheral cyanosis SKIN:  +; Normal in appearance without rashes, ulcerations or ecchymoses JOINTS: +; no evidence of joint deformity, swelling or tenderness NEUROLOGIC: +; Alert, interactive and appropriate; grossly moving all four extremities; 2+DTR's in all four extremities; normal sensory and motor exam. Normal gait    LABORATORY DATA  Initial consult on 02/14/2019  Component Date Value Ref Range Status   WBC (White Blood Cell Count) 02/14/2019 7.7  3.2 - 9.8 x109/L Final   Hemoglobin 02/14/2019 14.2  13.7 - 17.3 g/dL Final   Hematocrit 89/93/7979 46.7  39.0 - 49.0 % Final   Platelets 02/14/2019 244  150 - 450 x109/L Final   MCV (Mean Corpuscular Volume) 02/14/2019 64* 80 - 98 fL Final   MCH (Mean Corpuscular Hemoglobin)  02/14/2019 19.3* 26.5 - 34.0 pg Final   MCHC (Mean Corpuscular Hemoglobin * 02/14/2019 30.4* 31.5 - 36.3 % Final   RBC (Red Blood Cell Count) 02/14/2019 7.35* 4.37 - 5.74 x1012/L Final   RDW-CV (Red Cell Distribution Widt* 02/14/2019 20.0* 11.5 - 14.5 % Final   NRBC (Nucleated Red Blood Cell Cou* 02/14/2019 0.04* 0 x109/L Final   NRBC % (Nucleated Red Blood Cell %) 02/14/2019 0.5  % Final   MPV (Mean Platelet Volume) 02/14/2019 9.3  7.2 - 11.7 fL Final   Slide Review/Morphology 02/14/2019 Yes   Final   Microcytes,Schistocytes,Polychromasia,Burr cells,Target cells,No significant Plt morphology observed.,Anisocytosis,   Reticulocyte % 02/14/2019 1.59  0.70 - 2.00 % Final   Reticulocyte Count /L 02/14/2019 117.5  28.0 - 134.0 x10^9/L Final   Immature Reticulocyte Fraction 02/14/2019 26.9* 3.1 - 16.0 % Final   Sodium 02/14/2019 141  135 - 145 mmol/L Final   Potassium 02/14/2019 3.5  3.5 - 5.0 mmol/L Final   Chloride 02/14/2019 106  98 - 108 mmol/L Final   Carbon Dioxide (CO2) 02/14/2019 26  21 - 30 mmol/L Final  Urea Nitrogen (BUN) 02/14/2019 12  7 - 20 mg/dL Final   Creatinine 89/93/7979 1.2  0.6 - 1.3 mg/dL Final   Glucose 89/93/7979 89  70 - 140 mg/dL Final   Interpretive Data: Above is the NONFASTING reference range.   Below are the FASTING reference ranges:  NORMAL:   70-99 mg/dL  PREDIABETES: 899-874 mg/dL  DIABETES:  > 874 mg/dL    Calcium 89/93/7979 9.9  8.7 - 10.2 mg/dL Final   AST (Aspartate Aminotransferase) 02/14/2019 19  15 - 41 U/L Final   ALT (Alanine Aminotransferase) 02/14/2019 20  17 - 63 U/L Final   Bilirubin, Total 02/14/2019 0.8  0.4 - 1.5 mg/dL Final   Alk Phos (Alkaline Phosphatase) 02/14/2019 71  24 - 110 U/L Final   Albumin 02/14/2019 4.2  3.5 - 4.8 g/dL Final   Protein, Total 02/14/2019 7.0  6.2 - 8.1 g/dL Final   Anion Gap 89/93/7979 9  3 - 12 mmol/L Final   BUN/CREA Ratio 02/14/2019 10  6 - 27 Final   Glomerular Filtration  Rate (eGFR)  02/14/2019 85  mL/min/1.73sq m Final   Interpretive Ranges eGFR (CKD-EPI):  eGFR:      > 60 mL/min/1.73 sq m - Normal eGFR:      30 - 59 mL/min/1.73 sq m - Moderately Decreased eGFR:      15 - 29 mL/min/1.73 sq m - Severely Decreased eGFR:      < 15 mL/min/1.73 sq m -  Kidney Failure  Note: These GFR calculations do not apply in acute situations  when GFR is changing rapidly or in patients on dialysis.   Iron 02/14/2019 58  50 - 160 g/dL Final   Total Iron Binding Capacity (TIBC) 02/14/2019 344  261 - 478 g/dL Final   Percent Transferrin Saturation 02/14/2019 17  15 - 55 % Final   Ferritin 02/14/2019 150  11 - 204 ng/mL Final   Methylmalonic Acid, Quant 02/14/2019 0.13  <=0.40 mol/L Final   Interpretation 02/14/2019 Plasma methylmalonic acid concentration is within reference limits.   Final   Lab Director Signature 02/14/2019 Ashlee R. Carrington, Ph.D., [facility] Co-Director, Biochemical Genetics Lab  Tel: [phone], Fax: [phone]   Final   Hematology Comment 02/14/2019 Bloodfilm prepared, available in clinic   Final   IFE Serum 02/14/2019 No monoclonal component identified.  I have personally performed this interpretation.  Jadee L. Daryle, MD, PhD Assistant Professor of Pathology 02/15/2019     Final   SPE Result Narrative 02/14/2019 An asymmetric peak is present in the gamma region. Immunofixation electrophoresis will follow.  I have personally performed this interpretation.  Jadee L. Daryle, MD, PhD Assistant Professor of Pathology 02/15/2019     Final   SPE Total Protein 02/14/2019 7.0  5.8 - 7.8 g/dL Final   SPE Albumin 89/93/7979 4.12  3.50 - 5.00 g/dL Final   SPE Alpha 1 89/93/7979 0.27  0.20 - 0.50 g/dL Final   SPE Alpha 2 89/93/7979 0.67  0.50 - 1.10 g/dL Final   SPE Beta 89/93/7979 0.71  0.50 - 1.20 g/dL Final   SPE Gamma 89/93/7979 1.23  0.50 - 1.50 g/dL Final   Hemoglobin Electrophoresis Interpr* 02/14/2019 Patient has beta  thalassemia minor. Interpretation assumes a non-transfused sample.  I have personally performed this interpretation.  Jadee L. Daryle, MD, PhD Assistant Professor of Pathology 02/15/2019      Final   Hb A% 02/14/2019 93.7* 96.8 - 97.8 % Final   Hb F% 02/14/2019 1.2  % Final  Hb A2% 02/14/2019 5.1* 2.2 - 3.2 % Final   Segmented Neutrophil % 02/14/2019 55  37 - 80 % Final   Lymphocyte % 02/14/2019 34  10 - 50 % Final   Monocyte % 02/14/2019 3  0 - 12 % Final   Eosinophil %  02/14/2019 6  0 - 7 % Final   Basophil % 02/14/2019 2  0 - 2 % Final   NRBC (Nucleated Red Blood Cell Cou* 02/14/2019 3  PER 100/ WBC'S Final   Slide Review/Morphology 02/14/2019 Yes   Final   Microcytes,Schistocytes,Polychromasia,Burr cells,Target cells,No significant Plt morphology observed.,Anisocytosis,   Neutrophil Count, Absolute 02/14/2019 4.24  2.00 - 8.60 x109/L Final   Lymphocyte Count, Absolute 02/14/2019 2.62  0.60 - 4.20 x109/L Final   Monocyte Count, Absolute 02/14/2019 0.23  0.00 - 0.90 x109/L Final   Eosinophil Count, Absolute 02/14/2019 0.46  0.00 - 0.70 x109/L Final   Basophil Count, Absolute 02/14/2019 0.15  0.00 - 0.20 x109/L Final   NRBC, Absolute 02/14/2019 0.23* <=0.00 x109/L Final       BLOOD FILM REPORT  WBCs-Abnormal: Left shifted myeloid series. Lymphocytes remain normal in size without any predominance of large granular lymphocytes.  Red cells-Abnormal;: Dimorphic red cell population. Microcytic hypochromic cells and normocytic cells are present.  There was no rouleaux formation, nucleated red cells, or intra-cellular inclusions noted.  Platelets-Normal; The platelets are normal in number with a manual platelet count that is concordant with automated count. The platelets are generally large size, shape, and color without any clumping evident.      IMPRESSION: 1) Anemia: Colton Ramos is a 43 y.o. with recently diagnosed microcytic anemia. Patient's  laboratories and peripheral blood film from today are pending . 2) I will investigate further for an underlying cause for the anemia. Orders Placed This Encounter  Procedures   Complete Blood Count (CBC) with Differential    Standing Status:   Future    Number of Occurrences:   1    Standing Expiration Date:   02/14/2020   Reticulocytes    Standing Status:   Future    Number of Occurrences:   1    Standing Expiration Date:   02/14/2020   Comprehensive Metabolic Panel (CMP)    Standing Status:   Future    Number of Occurrences:   1    Standing Expiration Date:   02/14/2020   Iron and Total Iron Binding Capacity (TIBC)    Standing Status:   Future    Number of Occurrences:   1    Standing Expiration Date:   02/14/2020   Ferritin    Standing Status:   Future    Number of Occurrences:   1    Standing Expiration Date:   02/14/2020   Methylmalonic Acid, Quant    Standing Status:   Future    Number of Occurrences:   1    Standing Expiration Date:   02/14/2020   Blood Film- Special Heme    Standing Status:   Future    Number of Occurrences:   1    Standing Expiration Date:   02/14/2020   Immunofixation Electrophoresis (IFE), Serum    Standing Status:   Future    Number of Occurrences:   1    Standing Expiration Date:   02/14/2020   Protein Electrophoresis, Serum    Standing Status:   Future    Number of Occurrences:   1    Standing Expiration Date:   02/14/2020  Hemoglobin Electrophoresis    Standing Status:   Future    Number of Occurrences:   1    Standing Expiration Date:   02/14/2020   Manual White Blood Cell (WBC) Differential  3) I have answered Colton Ramos's questions and he verbalized understanding and agreement. I personally performed the service. (TP)  SOHEIR SAEED ADAM, MD

## 2020-09-30 NOTE — Telephone Encounter (Signed)
 Recent:  What is the date of your last related visit?  n/a Related acute medications Rx'd: n/a Home treatment tried: pressure COVID Vaccine: No  Relevant:  Allergies: Patient has no known allergies. Medications: n/a Health History: n/a Weight: n/a  Dispo: Go to ED   Reason for Disposition  [1] Bleeding present > 30 minutes AND [2] using correct method of direct pressure  Answer Assessment - Initial Assessment Questions 1. AMOUNT OF BLEEDING: How bad is the bleeding? How much blood was lost? Has the bleeding stopped?   - MILD: needed a couple tissues   - MODERATE: needed many tissues   - SEVERE: large blood clots, soaked many tissues, lasted more than 30 minutes      Currently, bleeding is mild to moderate 2. ONSET: When did the nosebleed start?      Started 2 days ago 3. FREQUENCY: How many nosebleeds have you had in the last 24 hours?     Has been present for 48 hours but coming going. Has stopped and started about 5-6 x 4. RECURRENT SYMPTOMS: Have there been other recent nosebleeds? If Yes, ask: How long did it take you to stop the bleeding? What worked best?      Never had a nosebleed like this before 5. CAUSE: What do you think caused this nosebleed?     Second to dust and environmental irritation 6. LOCAL FACTORS: Do you have any cold symptoms?, Have you been rubbing or picking at your nose?     No cold s/s. Blows nose a lot 7. SYSTEMIC FACTORS: Do you have high blood pressure or any bleeding problems?     HTN that is well controlled. Denies bleeding disorder 8. BLOOD THINNERS: Do you take any blood thinners? (e.g., coumadin, heparin, aspirin , Plavix)     Denies  9. OTHER SYMPTOMS: Do you have any other symptoms? (e.g., lightheadedness)     Denies  10. PREGNANCY: Is there any chance you are pregnant? When was your last menstrual period?       Male  Protocols used: NOSEBLEED-A-AH

## 2022-11-09 ENCOUNTER — Ambulatory Visit (INDEPENDENT_AMBULATORY_CARE_PROVIDER_SITE_OTHER): Payer: Medicare Other | Admitting: Internal Medicine

## 2022-11-09 VITALS — BP 145/99 | HR 80 | Resp 16 | Ht 70.0 in | Wt 190.0 lb

## 2022-11-09 DIAGNOSIS — F1090 Alcohol use, unspecified, uncomplicated: Secondary | ICD-10-CM

## 2022-11-09 DIAGNOSIS — G4733 Obstructive sleep apnea (adult) (pediatric): Secondary | ICD-10-CM

## 2022-11-09 DIAGNOSIS — Z7189 Other specified counseling: Secondary | ICD-10-CM | POA: Diagnosis not present

## 2022-11-09 DIAGNOSIS — I10 Essential (primary) hypertension: Secondary | ICD-10-CM | POA: Diagnosis not present

## 2022-11-09 NOTE — Progress Notes (Signed)
Sleep Medicine   Office Visit  Patient Name: Colton Ramos DOB: December 26, 1975 MRN [id number]   Brief History:  Colton Ramos presents for an initial consult for sleep evaluation and to establish care. The patient has a 10+ year history of sleep apnea and is currently on a CPAP. Sleep quality is sometimes good and sometimes poor. This is noted some nights. Prior to using a PAP, the patient's bed partner/ family reported the following symptoms:  witnessed apnea and loud snoring at night. The patient relates the following symptoms currently: No complaints, his machine is about 47 years old and he needs a new one.  He does not feel like it is functioning properly.  The patient goes to sleep at 11 pm and wakes up at 7-8 am. The patient reports a history of psychiatric problems (depression, and the pt admits to heavy alcohol use). The Epworth Sleepiness Score is 5 out of 24 . The patient's STOP-BANG score is 3. The patient relates  Cardiovascular risk factors include: hypertension. The patient is currently on a PAP@ 5-20 cmH2O. The patient reports using her/his CPAP and feels rested after sleeping with PAP.  The patient reports benefiting from PAP use and would like for her/him to continue using PAP. Reported sleepiness is  improved. The compliance download shows  61% compliance with an average use time of 4 hours  20 minutes. The AHI is 6.1.  The patient continues to require PAP therapy as a medical necessity in order to eliminate his/her sleep apnea.    ROS  General: (-) fever, (-) chills, (-) night sweat Nose and Sinuses: (-) nasal stuffiness or itchiness, (-) postnasal drip, (-) nosebleeds, (-) sinus trouble. Mouth and Throat: (-) sore throat, (-) hoarseness. Neck: (-) swollen glands, (-) enlarged thyroid, (-) neck pain. Respiratory: - cough, - shortness of breath, - wheezing. Neurologic: - numbness, - tingling. Psychiatric: - anxiety, - depression Sleep behavior: -sleep paralysis -hypnogogic  hallucinations -dream enactment      -vivid dreams -cataplexy -night terrors -sleep walking   Current Medication: Outpatient Encounter Medications as of 11/09/2022  Medication Sig   chlorthalidone (HYGROTON) 25 MG tablet Take 25 mg by mouth daily.   lisinopril (ZESTRIL) 40 MG tablet Take 40 mg by mouth daily.   amLODipine (NORVASC) 10 MG tablet Take 10 mg by mouth daily.   atorvastatin (LIPITOR) 40 MG tablet Take 40 mg by mouth at bedtime.   [DISCONTINUED] azithromycin (ZITHROMAX) 500 MG tablet Take 1 tablet (500 mg total) by mouth daily. Take 1 tablet daily for 3 days.   [DISCONTINUED] chlorpheniramine-HYDROcodone (TUSSIONEX PENNKINETIC ER) 10-8 MG/5ML SUER Take 5 mLs by mouth 2 (two) times daily.   [DISCONTINUED] cyclobenzaprine (FLEXERIL) 5 MG tablet Take 1 tablet (5 mg total) by mouth 3 (three) times daily as needed for muscle spasms.   [DISCONTINUED] ibuprofen (IBU) 800 MG tablet Take 1 tablet (800 mg total) by mouth every 8 (eight) hours as needed.   [DISCONTINUED] ketorolac (TORADOL) 10 MG tablet Take 1 tablet (10 mg total) by mouth every 8 (eight) hours.   [DISCONTINUED] nabumetone (RELAFEN) 750 MG tablet Take 1 tablet (750 mg total) by mouth 2 (two) times daily.   No facility-administered encounter medications on file as of 11/09/2022.    Surgical History: Past Surgical History:  Procedure Laterality Date   TUMOR EXCISION     benign    Medical History: Past Medical History:  Diagnosis Date   Hypertension     Family History: Non contributory to the present illness  Social History: Social History   Socioeconomic History   Marital status: Married    Spouse name: Not on file   Number of children: Not on file   Years of education: Not on file   Highest education level: Not on file  Occupational History   Not on file  Tobacco Use   Smoking status: Every Day    Packs/day: 1.5    Types: Cigarettes   Smokeless tobacco: Never  Substance and Sexual Activity   Alcohol  use: Yes    Comment: 2 beers/week   Drug use: No   Sexual activity: Yes    Birth control/protection: None  Other Topics Concern   Not on file  Social History Narrative   Not on file   Social Determinants of Health   Financial Resource Strain: Not on file  Food Insecurity: Not on file  Transportation Needs: Not on file  Physical Activity: Not on file  Stress: Not on file  Social Connections: Not on file  Intimate Partner Violence: Not on file    Vital Signs: Blood pressure (!) 145/99, pulse 80, resp. rate 16, height 5\' 10"  (1.778 m), weight 190 lb (86.2 kg), SpO2 97 %. Body mass index is 27.26 kg/m.   Examination: General Appearance: The patient is well-developed, well-nourished, and in no distress. Neck Circumference: 40 cm Skin: Gross inspection of skin unremarkable. Head: normocephalic, no gross deformities. Eyes: no gross deformities noted. ENT: ears appear grossly normal Neurologic: Alert and oriented. No involuntary movements.    STOP BANG RISK ASSESSMENT S (snore) Have you been told that you snore?     NO   T (tired) Are you often tired, fatigued, or sleepy during the day?   NO  O (obstruction) Do you stop breathing, choke, or gasp during sleep? NO   P (pressure) Do you have or are you being treated for high blood pressure? YES   B (BMI) Is your body index greater than 35 kg/m? NO   A (age) Are you 45 years old or older? NO   N (neck) Do you have a neck circumference greater than 16 inches?   YES   G (gender) Are you a male? YES   TOTAL STOP/BANG "YES" ANSWERS 3                                                               A STOP-Bang score of 2 or less is considered low risk, and a score of 5 or more is high risk for having either moderate or severe OSA. For people who score 3 or 4, doctors may need to perform further assessment to determine how likely they are to have OSA.         EPWORTH SLEEPINESS SCALE:  Scale:  (0)= no chance of dozing;  (1)= slight chance of dozing; (2)= moderate chance of dozing; (3)= high chance of dozing  Chance  Situtation    Sitting and reading: 0    Watching TV: 2    Sitting Inactive in public: 0    As a passenger in car: 0      Lying down to rest: 3    Sitting and talking: 0    Sitting quielty after lunch: 0    In a car, stopped in traffic: 0  TOTAL SCORE:   5 out of 24   CPAP COMPLIANCE DATA:  Date Range: 11/09/21 - 11/08/22  Average Daily Use: 4 hours 20 minutes  Median Use: 4 hours 55 minutes  Compliance for > 4 Hours: 61 %  AHI: 6.1 respiratory events per hour  Days Used: 333/365 days  Mask Leak: 9.6  95th Percentile Pressure: 11.3 cmh20   SLEEP STUDIES:  [facility] in 2021 - copy requested   LABS: No results found for this or any previous visit (from the past 2160 hour(s)).  Radiology: DG Lumbar Spine 2-3 Views  Result Date: 05/06/2017 CLINICAL DATA:  47 y/o  M; 3 weeks of lower back pain. EXAM: LUMBAR SPINE - 2-3 VIEW COMPARISON:  None. FINDINGS: Five lumbar type non-rib-bearing vertebral bodies. Normal lumbar lordosis without listhesis. Vertebral body heights are preserved. Mild loss of disc space height at the L5-S1 level. No acute fracture. IMPRESSION: No acute fracture or malalignment. Mild loss of L5-S1 intervertebral disc space height. Electronically Signed   By: Mitzi Hansen M.D.   On: 05/06/2017 22:06    No results found.  No results found.    Assessment and Plan: Patient Active Problem List   Diagnosis Date Noted   OSA (obstructive sleep apnea) 11/09/2022   CPAP use counseling 11/09/2022   Hypertension 11/09/2022   Heavy alcohol use 11/09/2022   1. OSA (obstructive sleep apnea) PLAN OSA:   Patient evaluation reveals severe OSA with AHI of 35 on PSG from 2021. He has been using APAP 5-20 for the last 10 years, he is not always consistent. His AHI is not optimally controlled at 6.1, some nights he is having upwards of 30  events/hour. He does admit to drinking beer and whiskey nightly, this was discussed at length, and the patient was advised to reduce use as this is likely contributing to his apnea. Patient has comorbid cardiovascular risk factors including: hypertension which could be exacerbated by pathologic sleep-disordered breathing.  Suggest: CPAP titration followed by machine replacement (his is past end of usable life)  to assess/treat the patient's sleep disordered breathing. The patient was also counselled on alcohol reduction  to optimize sleep health.   2. CPAP use counseling CPAP Counseling: had a lengthy discussion with the patient regarding the importance of PAP therapy in management of the sleep apnea. Patient appears to understand the risk factor reduction and also understands the risks associated with untreated sleep apnea. Patient will try to make a good faith effort to remain compliant with therapy. Also instructed the patient on proper cleaning of the device including the water must be changed daily if possible and use of distilled water is preferred. Patient understands that the machine should be regularly cleaned with appropriate recommended cleaning solutions that do not damage the PAP machine for example given white vinegar and water rinses. Other methods such as ozone treatment may not be as good as these simple methods to achieve cleaning.   3. Hypertension, unspecified type Hypertension Counseling:   The following hypertensive lifestyle modification were recommended and discussed:  1. Limiting alcohol intake to less than 1 oz/day of ethanol:(24 oz of beer or 8 oz of wine or 2 oz of 100-proof whiskey). 2. Take baby ASA 81 mg daily. 3. Importance of regular aerobic exercise and losing weight. 4. Reduce dietary saturated fat and cholesterol intake for overall cardiovascular health. 5. Maintaining adequate dietary potassium, calcium, and magnesium intake. 6. Regular monitoring of the blood  pressure. 7. Reduce sodium intake to less than  100 mmol/day (less than 2.3 gm of sodium or less than 6 gm of sodium choride)    4. Heavy alcohol use Advised as to reduction.    General Counseling: I have discussed the findings of the evaluation and examination with Colton Ramos.  I have also discussed any further diagnostic evaluation thatmay be needed or ordered today. Colton Ramos verbalizes understanding of the findings of todays visit. We also reviewed his medications today and discussed drug interactions and side effects including but not limited excessive drowsiness and altered mental states. We also discussed that there is always a risk not just to him but also people around him. he has been encouraged to call the office with any questions or concerns that should arise related to todays visit.  No orders of the defined types were placed in this encounter.       I have personally obtained a history, evaluated the patient, evaluated pertinent data, formulated the assessment and plan and placed orders.   This patient was seen today by Emmaline Kluver, PA-C in collaboration with Dr. Freda Munro.   Yevonne Pax, MD [facility] Diplomate ABMS Pulmonary and Critical Care Medicine Sleep medicine

## 2022-11-09 NOTE — Patient Instructions (Signed)
Living With Sleep Apnea Sleep apnea is a condition in which breathing pauses or becomes shallow during sleep. Sleep apnea is most commonly caused by a collapsed or blocked airway. People with sleep apnea usually snore loudly. They may have times when they gasp and stop breathing for 10 seconds or more during sleep. This may happen many times during the night. The breaks in breathing also interrupt the deep sleep that you need to feel rested. Even if you do not completely wake up from the gaps in breathing, your sleep may not be restful and you feel tired during the day. You may also have a headache in the morning and low energy during the day, and you may feel anxious or depressed. How can sleep apnea affect me? Sleep apnea increases your chances of extreme tiredness during the day (daytime fatigue). It can also increase your risk for health conditions, such as: Heart attack. Stroke. Obesity. Type 2 diabetes. Heart failure. Irregular heartbeat. High blood pressure. If you have daytime fatigue as a result of sleep apnea, you may be more likely to: Perform poorly at school or work. Fall asleep while driving. Have difficulty with attention. Develop depression or anxiety. Have sexual dysfunction. What actions can I take to manage sleep apnea? Sleep apnea treatment  If you were given a device to open your airway while you sleep, use it only as told by your health care provider. You may be given: An oral appliance. This is a custom-made mouthpiece that shifts your lower jaw forward. A continuous positive airway pressure (CPAP) device. This device blows air through a mask when you breathe out (exhale). A nasal expiratory positive airway pressure (EPAP) device. This device has valves that you put into each nostril. A bi-level positive airway pressure (BIPAP) device. This device blows air through a mask when you breathe in (inhale) and breathe out (exhale). You may need surgery if other treatments  do not work for you. Sleep habits Go to sleep and wake up at the same time every day. This helps set your internal clock (circadian rhythm) for sleeping. If you stay up later than usual, such as on weekends, try to get up in the morning within 2 hours of your normal wake time. Try to get at least 7-9 hours of sleep each night. Stop using a computer, tablet, and mobile phone a few hours before bedtime. Do not take long naps during the day. If you nap, limit it to 30 minutes. Have a relaxing bedtime routine. Reading or listening to music may relax you and help you sleep. Use your bedroom only for sleep. Keep your television and computer out of your bedroom. Keep your bedroom cool, dark, and quiet. Use a supportive mattress and pillows. Follow your health care provider's instructions for other changes to sleep habits. Nutrition Do not eat heavy meals in the evening. Do not have caffeine in the later part of the day. The effects of caffeine can last for more than 5 hours. Follow your health care provider's or dietitian's instructions for any diet changes. Lifestyle     Do not drink alcohol before bedtime. Alcohol can cause you to fall asleep at first, but then it can cause you to wake up in the middle of the night and have trouble getting back to sleep. Do not use any products that contain nicotine or tobacco. These products include cigarettes, chewing tobacco, and vaping devices, such as e-cigarettes. If you need help quitting, ask your health care provider. Medicines Take   over-the-counter and prescription medicines only as told by your health care provider. Do not use over-the-counter sleep medicine. You can become dependent on this medicine, and it can make sleep apnea worse. Do not use medicines, such as sedatives and narcotics, unless told by your health care provider. Activity Exercise on most days, but avoid exercising in the evening. Exercising near bedtime can interfere with  sleeping. If possible, spend time outside every day. Natural light helps regulate your circadian rhythm. General information Lose weight if you need to, and maintain a healthy weight. Keep all follow-up visits. This is important. If you are having surgery, make sure to tell your health care provider that you have sleep apnea. You may need to bring your device with you. Where to find more information Learn more about sleep apnea and daytime fatigue from: American Sleep Association: sleepassociation.org National Sleep Foundation: sleepfoundation.org National Heart, Lung, and Blood Institute: nhlbi.nih.gov Summary Sleep apnea is a condition in which breathing pauses or becomes shallow during sleep. Sleep apnea can cause daytime fatigue and other serious health conditions. You may need to wear a device while sleeping to help keep your airway open. If you are having surgery, make sure to tell your health care provider that you have sleep apnea. You may need to bring your device with you. Making changes to sleep habits, diet, lifestyle, and activity can help you manage sleep apnea. This information is not intended to replace advice given to you by your health care provider. Make sure you discuss any questions you have with your health care provider. Document Revised: 12/04/2020 Document Reviewed: 04/05/2020 Elsevier Patient Education  2023 Elsevier Inc.  

## 2023-01-25 ENCOUNTER — Emergency Department
Admission: EM | Admit: 2023-01-25 | Discharge: 2023-01-25 | Discharge status: Against Medical Advice | Payer: Medicare Other | Attending: Emergency Medicine | Admitting: Emergency Medicine

## 2023-01-25 ENCOUNTER — Other Ambulatory Visit: Payer: Self-pay

## 2023-01-25 ENCOUNTER — Emergency Department: Payer: Medicare Other

## 2023-01-25 DIAGNOSIS — Z5321 Procedure and treatment not carried out due to patient leaving prior to being seen by health care provider: Secondary | ICD-10-CM | POA: Diagnosis not present

## 2023-01-25 DIAGNOSIS — R079 Chest pain, unspecified: Secondary | ICD-10-CM | POA: Diagnosis present

## 2023-01-25 DIAGNOSIS — R002 Palpitations: Secondary | ICD-10-CM | POA: Insufficient documentation

## 2023-01-25 LAB — CBC
HCT: 45.5 % (ref 39.0–52.0)
Hemoglobin: 14.4 g/dL (ref 13.0–17.0)
MCH: 19.5 pg — ABNORMAL LOW (ref 26.0–34.0)
MCHC: 31.6 g/dL (ref 30.0–36.0)
MCV: 61.5 fL — ABNORMAL LOW (ref 80.0–100.0)
Platelets: 247 10*3/uL (ref 150–400)
RBC: 7.4 MIL/uL — ABNORMAL HIGH (ref 4.22–5.81)
RDW: 19.7 % — ABNORMAL HIGH (ref 11.5–15.5)
WBC: 5.9 10*3/uL (ref 4.0–10.5)
nRBC: 0.3 % — ABNORMAL HIGH (ref 0.0–0.2)

## 2023-01-25 LAB — BASIC METABOLIC PANEL
Anion gap: 12 (ref 5–15)
BUN: 23 mg/dL — ABNORMAL HIGH (ref 6–20)
CO2: 27 mmol/L (ref 22–32)
Calcium: 9.7 mg/dL (ref 8.9–10.3)
Chloride: 101 mmol/L (ref 98–111)
Creatinine, Ser: 1.09 mg/dL (ref 0.61–1.24)
GFR, Estimated: >60 mL/min (ref >60)
Glucose, Bld: 107 mg/dL — ABNORMAL HIGH (ref 70–99)
Potassium: 3.1 mmol/L — ABNORMAL LOW (ref 3.5–5.1)
Sodium: 140 mmol/L (ref 135–145)

## 2023-01-25 LAB — TROPONIN I (HIGH SENSITIVITY): Troponin I (High Sensitivity): 5 ng/L (ref <18)

## 2023-01-25 NOTE — ED Triage Notes (Signed)
Pt presents to the ED POV from home for left sided chest pain/palpitations. States that this has been going on intermittently for 2 weeks, but that it started as a constant feeling last night. Pt A&Ox4 at time of triage. Ambulatory to triage room.

## 2024-04-30 ENCOUNTER — Emergency Department
Admission: EM | Admit: 2024-04-30 | Discharge: 2024-04-30 | Disposition: A | Discharge status: Home / Self Care | Payer: Self-pay | Attending: Emergency Medicine | Admitting: Emergency Medicine

## 2024-04-30 ENCOUNTER — Emergency Department: Payer: Self-pay

## 2024-04-30 ENCOUNTER — Other Ambulatory Visit: Payer: Self-pay

## 2024-04-30 DIAGNOSIS — R0789 Other chest pain: Secondary | ICD-10-CM | POA: Insufficient documentation

## 2024-04-30 DIAGNOSIS — M546 Pain in thoracic spine: Secondary | ICD-10-CM | POA: Insufficient documentation

## 2024-04-30 DIAGNOSIS — I10 Essential (primary) hypertension: Secondary | ICD-10-CM | POA: Insufficient documentation

## 2024-04-30 DIAGNOSIS — R079 Chest pain, unspecified: Secondary | ICD-10-CM

## 2024-04-30 LAB — BASIC METABOLIC PANEL WITH GFR
Anion gap: 11 (ref 5–15)
BUN: 14 mg/dL (ref 6–20)
CO2: 25 mmol/L (ref 22–32)
Calcium: 9.8 mg/dL (ref 8.9–10.3)
Chloride: 105 mmol/L (ref 98–111)
Creatinine, Ser: 1.01 mg/dL (ref 0.61–1.24)
GFR, Estimated: >60 mL/min
Glucose, Bld: 93 mg/dL (ref 70–99)
Potassium: 3.6 mmol/L (ref 3.5–5.1)
Sodium: 141 mmol/L (ref 135–145)

## 2024-04-30 LAB — CBC
HCT: 45.5 % (ref 39.0–52.0)
Hemoglobin: 13.7 g/dL (ref 13.0–17.0)
MCH: 18.9 pg — ABNORMAL LOW (ref 26.0–34.0)
MCHC: 30.1 g/dL (ref 30.0–36.0)
MCV: 62.9 fL — ABNORMAL LOW (ref 80.0–100.0)
Platelets: 238 K/uL (ref 150–400)
RBC: 7.23 MIL/uL — ABNORMAL HIGH (ref 4.22–5.81)
RDW: 19.9 % — ABNORMAL HIGH (ref 11.5–15.5)
WBC: 7.4 K/uL (ref 4.0–10.5)
nRBC: 0 % (ref 0.0–0.2)

## 2024-04-30 LAB — TROPONIN T, HIGH SENSITIVITY
Troponin T High Sensitivity: <15 ng/L (ref 0–19)
Troponin T High Sensitivity: <15 ng/L (ref 0–19)

## 2024-04-30 LAB — D-DIMER, QUANTITATIVE: D-Dimer, Quant: 0.41 ug{FEU}/mL (ref 0.00–0.50)

## 2024-04-30 MED ORDER — LIDOCAINE 5 % EX PTCH
1.0000 | MEDICATED_PATCH | CUTANEOUS | Status: DC
  Administered 2024-04-30: 1 via TRANSDERMAL
  Filled 2024-04-30: qty 1

## 2024-04-30 MED ORDER — LIDOCAINE 5 % EX PTCH: 1.0000 | MEDICATED_PATCH | CUTANEOUS | 0 refills | Status: AC

## 2024-04-30 MED ORDER — ACETAMINOPHEN 500 MG PO TABS
1000.0000 mg | ORAL_TABLET | Freq: Once | ORAL | Status: AC
  Administered 2024-04-30: 1000 mg via ORAL
  Filled 2024-04-30: qty 2

## 2024-04-30 NOTE — Discharge Instructions (Addendum)
 I put in a referral for you for cardiology, please make sure to follow-up.  You can take 650 mg of Tylenol  every 6 hours as needed for pain.  Also use Lidoderm  patches as prescribed.

## 2024-04-30 NOTE — ED Provider Notes (Signed)
 [facility] Provider Note    Event Date/Time   First MD Initiated Contact with Patient 04/30/24 1111     (approximate)   History   Chest Pain   HPI  Tailor Westfall is a 48 y.o. male with history of hypertension, alcohol use, OSA, presenting with chest pain.  States that he noticed left anterior chest pain last night, it is reproducible.  Does get worse when he takes a deep breath.  Denies any cough or fever, no recent exertion or strain.  No recent trauma or falls.  He denies any numbness tingling, no prior cardiac history.  States that he has had several days of left upper back pain as well.  It is pressure-like.  Not sharp, not tearing.  States the back pain is improving.  No recent travel or surgeries, no unilateral calf swelling Or Tenderness, No History of Malignancy.  On independent chart review, he was seen in July by primary care, was there for sleep evaluation, he was counseled on CPAP use, has history of heavy alcohol use, was advised to reduce, instructed to take baby aspirin  daily.     Physical Exam   Triage Vital Signs: ED Triage Vitals  Encounter Vitals Group     BP 04/30/24 1020 (!) 173/112     Girls Systolic BP Percentile --      Girls Diastolic BP Percentile --      Boys Systolic BP Percentile --      Boys Diastolic BP Percentile --      Pulse Rate 04/30/24 1020 80     Resp 04/30/24 1020 18     Temp 04/30/24 1020 98 F (36.7 C)     Temp src --      SpO2 04/30/24 1020 100 %     Weight 04/30/24 1019 190 lb (86.2 kg)     Height 04/30/24 1019 5' 10 (1.778 m)     Head Circumference --      Peak Flow --      Pain Score 04/30/24 1019 8     Pain Loc --      Pain Education --      Exclude from Growth Chart --     Most recent vital signs: Vitals:   04/30/24 1020  BP: (!) 173/112  Pulse: 80  Resp: 18  Temp: 98 F (36.7 C)  SpO2: 100%     General: Awake, no distress.  CV:  Good peripheral perfusion.  Resp:  Normal  effort.  No tachypnea or respiratory distress, left anterior thoracic cage tenderness on exam Abd:  No distention.  Soft nontender Other:  Equal radial pulses bilaterally, no focal weakness or numbness.  No unilateral calf swelling or tenderness.   ED Results / Procedures / Treatments   Labs (all labs ordered are listed, but only abnormal results are displayed) Labs Reviewed  CBC - Abnormal; Notable for the following components:      Result Value   RBC 7.23 (*)    MCV 62.9 (*)    MCH 18.9 (*)    RDW 19.9 (*)    All other components within normal limits  BASIC METABOLIC PANEL WITH GFR  D-DIMER, QUANTITATIVE  TROPONIN T, HIGH SENSITIVITY  TROPONIN T, HIGH SENSITIVITY     EKG  EKG shows, sinus rhythm, rate of 91, normal QRS, normal QTc, no obvious ischemic ST elevation, T wave flattening to V6, this is changed compared to prior   RADIOLOGY On my independent interpretation,  chest x-ray without obvious consolidation   PROCEDURES:  Critical Care performed: No  Procedures   MEDICATIONS ORDERED IN ED: Medications  lidocaine  (LIDODERM ) 5 % 1 patch (1 patch Transdermal Patch Applied 04/30/24 1146)  acetaminophen  (TYLENOL ) tablet 1,000 mg (1,000 mg Oral Given 04/30/24 1146)     IMPRESSION / MDM / ASSESSMENT AND PLAN / ED COURSE  I reviewed the triage vital signs and the nursing notes.                              Differential diagnosis includes, but is not limited to, angina, ACS, GERD, musculoskeletal pain, strain, costochondritis, did consider PE but no shortness of breath, no hypoxia or tachycardia, he did not describe the pain is worse with deep breath, get a D-dimer.  Did also consider dissection but patient's pain is not sharp or tearing, no tingling, has equal radial pulses.  Will get labs, EKG, troponin, chest x-ray, Lidoderm  patch, Tylenol   Patient's presentation is most consistent with acute presentation with potential threat to life or bodily  function.  Independent interpretation of labs and imaging below.  On reassessment patient is feeling significantly better.  Discussed with him about imaging and lab results.  Will place a referral for him for cardiology since he states that he has not seen one in the past.  Will also send some Lidoderm  patches to his pharmacy.  Discussed that about taking Tylenol  as needed for pain.  Also strict return precautions given.  Considered but no indication for inpatient admission at this time, he is safe for outpatient management.  Discharge.  Shared decision making done with patient and he is agreeable with this plan.    Clinical Course as of 04/30/24 1244  Sun Apr 30, 2024  1141 DG Chest 2 View 1. No acute findings.  [TT]  1241 Independent review of labs, electrolytes not severely deranged, D-dimer is not elevated, troponin x 2 is not elevated. [TT]    Clinical Course User Index [TT] Waymond, Lorelle Cummins, MD     FINAL CLINICAL IMPRESSION(S) / ED DIAGNOSES   Final diagnoses:  Chest pain, unspecified type  Acute left-sided thoracic back pain     Rx / DC Orders   ED Discharge Orders          Ordered    Ambulatory referral to Cardiology       Comments: If you have not heard from the Cardiology office within the next 72 hours please call [phone].   04/30/24 1241    lidocaine  (LIDODERM ) 5 %  Every 24 hours        04/30/24 1242             Note:  This document was prepared using Dragon voice recognition software and may include unintentional dictation errors.    [place] Lorelle Cummins, MD 04/30/24 [phone]

## 2024-04-30 NOTE — ED Triage Notes (Signed)
 Pt comes with cp that started last night. Pt states it started in his back and radiated around to the left side of his chest. Pt states he thought it was going to go away.  Pt denies any N/V/D. Pt denies any hx of this.
